# Patient Record
Sex: Male | Born: 1980 | Race: White | Hispanic: Yes | Marital: Single | Smoking: Current every day smoker
Health system: Southern US, Community
[De-identification: ages and names within clinical notes are randomized; demographics above are authoritative.]

---

## 2006-08-22 ENCOUNTER — Emergency Department (HOSPITAL_COMMUNITY): Admission: EM | Admit: 2006-08-22 | Discharge: 2006-08-22 | Payer: Self-pay | Admitting: Emergency Medicine

## 2013-04-15 ENCOUNTER — Encounter (HOSPITAL_COMMUNITY): Payer: Self-pay | Admitting: Emergency Medicine

## 2013-04-15 ENCOUNTER — Emergency Department (HOSPITAL_COMMUNITY)
Admission: EM | Admit: 2013-04-15 | Discharge: 2013-04-15 | Disposition: A | Discharge status: Home / Self Care | Payer: Self-pay | Attending: Emergency Medicine | Admitting: Emergency Medicine

## 2013-04-15 DIAGNOSIS — F17200 Nicotine dependence, unspecified, uncomplicated: Secondary | ICD-10-CM | POA: Insufficient documentation

## 2013-04-15 DIAGNOSIS — R609 Edema, unspecified: Secondary | ICD-10-CM | POA: Insufficient documentation

## 2013-04-15 DIAGNOSIS — K047 Periapical abscess without sinus: Secondary | ICD-10-CM | POA: Insufficient documentation

## 2013-04-15 DIAGNOSIS — K029 Dental caries, unspecified: Secondary | ICD-10-CM | POA: Insufficient documentation

## 2013-04-15 MED ORDER — PENICILLIN V POTASSIUM 500 MG PO TABS: 500.0000 mg | ORAL_TABLET | Freq: Four times a day (QID) | ORAL | Status: AC

## 2013-04-15 MED ORDER — HYDROCODONE-ACETAMINOPHEN 5-325 MG PO TABS: 1.0000 | ORAL_TABLET | ORAL | Status: AC | PRN

## 2013-04-15 NOTE — ED Provider Notes (Signed)
Medical screening examination/treatment/procedure(s) were performed by non-physician practitioner and as supervising physician I was immediately available for consultation/collaboration.  EKG Interpretation   None         Rolan BuccoMelanie Merrin Mcvicker, MD 04/15/13 [phone]

## 2013-04-15 NOTE — ED Provider Notes (Signed)
CSN: [id number]     Arrival date & time 04/15/13  0408 History   First MD Initiated Contact with Patient 04/15/13 [phone]     Chief Complaint  Patient presents with  . Dental Pain   (Consider location/radiation/quality/duration/timing/severity/associated sxs/prior Treatment) The history is provided by the patient and medical records.   This is a 33 year old male with no significant past surgical history presenting to the ED for left upper dental pain and left facial swelling over the past 3 days. Patient states he has a significant cavity of his left upper molar which was evaluated by a dentist last year. States over the weekend pain has intensified and swelling has progressed. Denies any fevers, sweats, or chills. No difficulty swallowing or breathing. Has been taking over-the-counter Motrin without improvement. Vital signs stable on arrival  History reviewed. No pertinent past medical history. History reviewed. No pertinent past surgical history. No family history on file. History  Substance Use Topics  . Smoking status: Current Every Day Smoker  . Smokeless tobacco: Not on file  . Alcohol Use: Yes    Review of Systems  HENT: Positive for dental problem.   All other systems reviewed and are negative.    Allergies  Review of patient's allergies indicates no known allergies.  Home Medications   Current Outpatient Rx  Name  Route  Sig  Dispense  Refill  . ibuprofen (ADVIL,MOTRIN) 200 MG tablet   Oral   Take 400 mg by mouth every 6 (six) hours as needed for moderate pain.         Marland Kitchen. HYDROcodone-acetaminophen (NORCO/VICODIN) 5-325 MG per tablet   Oral   Take 1 tablet by mouth every 4 (four) hours as needed.   12 tablet   0   . penicillin v potassium (VEETID) 500 MG tablet   Oral   Take 1 tablet (500 mg total) by mouth 4 (four) times daily.   40 tablet   0    BP 158/91  Pulse 87  Temp(Src) 98.4 F (36.9 C) (Oral)  Resp 18  SpO2 98%  Physical Exam  Nursing note and  vitals reviewed. Constitutional: He is oriented to person, place, and time. He appears well-developed and well-nourished. No distress.  HENT:  Head: Normocephalic and atraumatic.  Mouth/Throat: Uvula is midline, oropharynx is clear and moist and mucous membranes are normal. No oral lesions. No trismus in the jaw. Abnormal dentition. Dental abscesses and dental caries present. No uvula swelling. No oropharyngeal exudate, posterior oropharyngeal edema, posterior oropharyngeal erythema or tonsillar abscesses.    Teeth largely in fair dentition, left upper molar broken with large cavity present, surrounding gingiva swollen and erythematous with dental abscess present, left upper cheek swelling, no neck swelling, handling secretions appropriately, no trismus  Eyes: Conjunctivae and EOM are normal. Pupils are equal, round, and reactive to light.  Neck: Normal range of motion.  Cardiovascular: Normal rate, regular rhythm and normal heart sounds.   Pulmonary/Chest: Effort normal and breath sounds normal. No respiratory distress. He has no wheezes.  Abdominal: Soft. Bowel sounds are normal.  Musculoskeletal: Normal range of motion.  Neurological: He is alert and oriented to person, place, and time.  Skin: Skin is warm and dry. He is not diaphoretic.  Psychiatric: He has a normal mood and affect.    ED Course  Procedures (including critical care time) Labs Review Labs Reviewed - No data to display Imaging Review No results found.  EKG Interpretation   None  MDM   1. Dental abscess    Dental pain and facial swelling with dental abscess present.  No neck swelling, airway patent.  Will start on abx, pain meds.  FU with dentist-- referral and resource guide provided. Discussed plan with pt, he agreed. Return precautions advised.  Garlon Hatchet, PA-C 04/15/13 [phone]

## 2013-04-15 NOTE — Discharge Instructions (Signed)
Take the prescribed medication as directed.  Do not drive while taking vicodin. Follow-up with dentist-- referral provided and resource guide below with additional offices. Return to the ED for new or worsening symptoms.   Emergency Department Resource Guide 1) Find a Doctor and Pay Out of Pocket Although you won't have to find out who is covered by your insurance plan, it is a good idea to ask around and get recommendations. You will then need to call the office and see if the doctor you have chosen will accept you as a new patient and what types of options they offer for patients who are self-pay. Some doctors offer discounts or will set up payment plans for their patients who do not have insurance, but you will need to ask so you aren't surprised when you get to your appointment.  2) Contact Your Local Health Department Not all health departments have doctors that can see patients for sick visits, but many do, so it is worth a call to see if yours does. If you don't know where your local health department is, you can check in your phone book. The CDC also has a tool to help you locate your state's health department, and many state websites also have listings of all of their local health departments.  3) Find a Walk-in Clinic If your illness is not likely to be very severe or complicated, you may want to try a walk in clinic. These are popping up all over the country in pharmacies, drugstores, and shopping centers. They're usually staffed by nurse practitioners or physician assistants that have been trained to treat common illnesses and complaints. They're usually fairly quick and inexpensive. However, if you have serious medical issues or chronic medical problems, these are probably not your best option.  No Primary Care Doctor: - Call Health Connect at  [phone] - they can help you locate a primary care doctor that  accepts your insurance, provides certain services, etc. - Physician Referral  Service- [phone]  Chronic Pain Problems: Organization         Address  Phone   Notes  [facility]  [phone] Patients need to be referred by their primary care doctor.   Medication Assistance: Organization         Address  Phone   Notes  [facility] [address] [place], [place] [address] [phone]) [phone] --Must be a resident of [facility] [place] -- Must have NO insurance coverage whatsoever (no Medicaid/ Medicare, etc.) -- The pt. MUST have a primary care doctor that directs their care regularly and follows them in the community   MedAssist  [phone]   [employer]  [phone]    Agencies that provide inexpensive medical care: Organization         Address  Phone   Notes  [facility]  [phone]   [facility]    [phone]   [facility] [address] [place], [place] [address] [phone]   [facility] [address], [place] [phone]) [phone]   [facility]anned [facility]    [phone]) [phone]   [facility]    [phone]   [facility] and [facility]  [address], [place] Phone:  [phone]) [phone], Fax:  [phone] Hours of Operation:  9 am - 6 pm, M-F.  Also accepts Medicaid/Medicare and self-pay.  [facility]  [address], [place] Phone: [phone], Fax: [phone]. Hours of Operation:  8:30 am - 5:30 pm, M-F.  Also accepts Medicaid and self-pay.  [facility] [place] [address] [place] Phone: [phone]   George Mason, [place], [place] [phone], Ext. [phone] Mondays & Thursdays: 7-9 AM.  First 15 patients are seen on a first come, first serve basis.    [place] Providers:  Organization         Address  Phone   Notes  [facility] [address], [place] [phone] Also accepts self-pay patients.  [facility] [address], [place]  [phone]   [place], [address], [place] [phone]   [facility] [address], [place] [phone]   Lucianne Lei [address], [place]   [phone] Only accepts [place] Access [place] patients after they have their name applied to their card.   Self-Pay (no insurance) in [facility]:  Organization         Address  Phone   Notes  Sickle Cell Patients, [facility] [place] [phone]   [facility] [place] [phone]   [facility] [place]  [place], [place],  [phone]   [facility]/Dr. Osei-Bonsu  [address], [place] or [address], [place] [phone] Phone number for both [place] and [place] locations is the same.  Urgent Medical and [facility] [address], [place] [phone]   [facility] [address] [place] or [address] [phone]   [facility] [address], [place] [phone], phone; [phone], fax Sees patients 1st and 3rd Saturday of every month.  Must not qualify for public or private insurance (i.e. Medicaid, Medicare, [place] Health Choice, Veterans' Benefits)  Household income should be no more than 200% of the poverty level The clinic cannot treat you if you are pregnant or think you are pregnant  Sexually transmitted diseases are not treated at the clinic.    Dental Care: Organization         Address  Phone  Notes  [facility] Department of [facility] [phone] Accepts children up to age 4 who are enrolled in  [place] or [place]; pregnant women with a Medicaid card; and children who have applied for Medicaid or [place] Health Choice, but were declined, whose parents can pay a reduced fee at time of service.  [facility] of [facility]  [address], [place] [phone] Accepts children up to age 46 who are enrolled in [place] or [place]; pregnant women with a Medicaid card; and children who have applied for Medicaid or [place] Health Choice, but were declined, whose parents can pay a reduced fee at time of service.  [place] Adult [facility] Access PROGRAM  [place] [phone] Patients are seen by appointment only. Walk-ins are not accepted. [facility] will see patients 39 years of age and older. Monday - Tuesday (8am-5pm) Most Wednesdays (8:30-5pm) $30 per visit, cash only  [place] [facility]  [address], [facility] [phone] Patients are seen by appointment only. Walk-ins are not accepted. [place] will see patients 55 years of age and older. One Wednesday Evening (Monthly: Volunteer Based).  $30 per visit, cash only  [place]  [phone] for adults; Children under age 16, call Graduate Pediatric Dentistry at [phone]. Children aged 15-14, please call [phone] to request a pediatric application.  Dental services are provided in all areas of dental care including fillings, crowns and bridges, complete and partial dentures, implants, gum treatment, root canals, and extractions. Preventive care is also provided. Treatment is provided to both adults and children. Patients are selected via a lottery and there is often a waiting list.   [facility] [address] [place]  [phone] [email or web address]   [facility] [address] [place], [place] [phone], Ext. [phone] Second and Fourth Thursday of each month, opens at 6:30  AM; Clinic ends at 9 AM.  Patients are seen on a first-come first-served basis, and a limited number are seen during each clinic.   [facility]  [address] Hillard Danker [place], [place] [phone]   Eligibility Requirements You must have lived in [place], [place], or [place] counties for at least the last three months.   You cannot be eligible for state or federal sponsored [employer], including [employer], [place], or [employer].   You generally cannot be eligible for healthcare insurance through your employer.    How to apply: Eligibility screenings are held every Tuesday and Wednesday afternoon from 1:00 pm until 4:00 pm. You do not need an appointment for the interview!  [facility] [address] [place], [place]   [employer]  [place] Department  [place]  [phone]    Behavioral Health Resources in the Community: Intensive Outpatient Programs Organization         Address  Phone  Notes  [place]. [address], [place], [place] [phone]   [facility] [address], [place], [employer]   ADS: Alcohol & Drug Svcs [address], [place], [place]   [place] [address]  [place], [place] or [phone]   Substance Abuse Resources Organization         Address  Phone  Notes  Alcohol and Drug Services  [phone]   [place]  [phone]   [facility] [place]   Chinita Pester  [phone]   Residential & Outpatient Substance Abuse Program  [phone]   Psychological Services Organization         Address  Phone  Notes  [facility] [place]  [place]  [phone]   [place] [address] [place] [phone] or  [phone]    Mobile Crisis Teams Organization         Address  Phone  Notes  [facility], Mobile Crisis Care Unit  [phone]   [facility]  [address] [place], [place]   [place] [address] [place] [phone]    Self-Help/Support Groups Organization         Address  Phone             Notes  [facility] [place] - variety of support groups  [place] Call for more information  Narcotics Anonymous (NA), [facility]aring [facility] [address], [employer]   2 meetings at this location   Special educational needs teacher         Address  Phone  Notes  [facility] [place],    [place]  [phone]   [facility]  [address] [place] [phone], [place], [place]   [place], [place] [phone] Admissions: 8am-3pm M-F  Incentives Substance [place] [address]    [place], [place] [phone]   [facility] [address] [place], [place], [place]   [facility] [address]  [place], [place]   [facility] - Intensive Outpatient [address], [place], [place]   [facility] ([place].,  [place], [place] [phone] or [phone]   [facility] Services (RTS) [address], [place], [place] Accepts Medicaid  [facility] [address],  [place] [phone] Substance Abuse/Addiction Treatment   [facility] Organization         Address  Phone  Notes  [facility]  [phone]   Domenic Schwab, PhD [address] Arlis Porta [place], [place]   [phone] or [phone]   [place], [place] [phone]   [facility] [address]  [place], [place] [phone] Insurance/Medicaid/sponsorship through [facility] and Families [address], [address] [place]                                    [place], [place] [phone] [place] [address], [place] [phone]    Dr. Adele Schilder  [phone]   [facility] Dept. 1) [address], [place] 2) [place] 3)  [place] [address], [place] [phone]  [phone]   [place] [phone] or [phone] (After Hours)

## 2013-04-15 NOTE — ED Notes (Signed)
The pt is c/o a toothache since Friday with lt face swelling

## 2013-04-15 NOTE — ED Notes (Signed)
Report received, assumed care.  

## 2016-02-26 ENCOUNTER — Encounter (HOSPITAL_COMMUNITY): Payer: Self-pay | Admitting: Emergency Medicine

## 2016-02-26 ENCOUNTER — Emergency Department (HOSPITAL_COMMUNITY)
Admission: EM | Admit: 2016-02-26 | Discharge: 2016-02-26 | Disposition: A | Discharge status: Home / Self Care | Payer: Self-pay | Attending: Emergency Medicine | Admitting: Emergency Medicine

## 2016-02-26 ENCOUNTER — Emergency Department (HOSPITAL_COMMUNITY): Payer: Self-pay

## 2016-02-26 DIAGNOSIS — R51 Headache: Secondary | ICD-10-CM

## 2016-02-26 DIAGNOSIS — F17200 Nicotine dependence, unspecified, uncomplicated: Secondary | ICD-10-CM | POA: Insufficient documentation

## 2016-02-26 DIAGNOSIS — G4489 Other headache syndrome: Secondary | ICD-10-CM | POA: Insufficient documentation

## 2016-02-26 DIAGNOSIS — R519 Headache, unspecified: Secondary | ICD-10-CM

## 2016-02-26 DIAGNOSIS — Z79899 Other long term (current) drug therapy: Secondary | ICD-10-CM | POA: Insufficient documentation

## 2016-02-26 DIAGNOSIS — I677 Cerebral arteritis, not elsewhere classified: Secondary | ICD-10-CM | POA: Insufficient documentation

## 2016-02-26 DIAGNOSIS — R791 Abnormal coagulation profile: Secondary | ICD-10-CM | POA: Insufficient documentation

## 2016-02-26 LAB — URINALYSIS, ROUTINE W REFLEX MICROSCOPIC
BILIRUBIN URINE: NEGATIVE
Glucose, UA: NEGATIVE mg/dL
Hgb urine dipstick: NEGATIVE
KETONES UR: NEGATIVE mg/dL
Leukocytes, UA: NEGATIVE
NITRITE: NEGATIVE
PH: 7.5 (ref 5.0–8.0)
PROTEIN: 30 mg/dL — AB
Specific Gravity, Urine: 1.039 — ABNORMAL HIGH (ref 1.005–1.030)

## 2016-02-26 LAB — APTT: aPTT: 29 seconds (ref 24–36)

## 2016-02-26 LAB — URINE MICROSCOPIC-ADD ON
RBC / HPF: NONE SEEN RBC/hpf (ref 0–5)
Squamous Epithelial / LPF: NONE SEEN

## 2016-02-26 LAB — COMPREHENSIVE METABOLIC PANEL
ALBUMIN: 4.7 g/dL (ref 3.5–5.0)
ALK PHOS: 79 U/L (ref 38–126)
ALT: 51 U/L (ref 17–63)
AST: 36 U/L (ref 15–41)
Anion gap: 11 (ref 5–15)
BILIRUBIN TOTAL: 1.1 mg/dL (ref 0.3–1.2)
BUN: 9 mg/dL (ref 6–20)
CALCIUM: 9.5 mg/dL (ref 8.9–10.3)
CO2: 25 mmol/L (ref 22–32)
CREATININE: 0.89 mg/dL (ref 0.61–1.24)
Chloride: 101 mmol/L (ref 101–111)
GFR calc Af Amer: >60 mL/min (ref >60)
GLUCOSE: 114 mg/dL — AB (ref 65–99)
POTASSIUM: 3.7 mmol/L (ref 3.5–5.1)
Sodium: 137 mmol/L (ref 135–145)
TOTAL PROTEIN: 8.3 g/dL — AB (ref 6.5–8.1)

## 2016-02-26 LAB — CBC
HEMATOCRIT: 49.4 % (ref 39.0–52.0)
Hemoglobin: 18.1 g/dL — ABNORMAL HIGH (ref 13.0–17.0)
MCH: 32.5 pg (ref 26.0–34.0)
MCHC: 36.6 g/dL — AB (ref 30.0–36.0)
MCV: 88.7 fL (ref 78.0–100.0)
PLATELETS: 257 10*3/uL (ref 150–400)
RBC: 5.57 MIL/uL (ref 4.22–5.81)
RDW: 13.1 % (ref 11.5–15.5)
WBC: 10.1 10*3/uL (ref 4.0–10.5)

## 2016-02-26 LAB — PROTIME-INR
INR: 0.99
PROTHROMBIN TIME: 13.1 s (ref 11.4–15.2)

## 2016-02-26 MED ORDER — KETOROLAC TROMETHAMINE 30 MG/ML IJ SOLN
30.0000 mg | Freq: Once | INTRAMUSCULAR | Status: AC
  Administered 2016-02-26: 30 mg via INTRAVENOUS
  Filled 2016-02-26: qty 1

## 2016-02-26 MED ORDER — DEXAMETHASONE SODIUM PHOSPHATE 10 MG/ML IJ SOLN
10.0000 mg | Freq: Once | INTRAMUSCULAR | Status: AC
  Administered 2016-02-26: 10 mg via INTRAVENOUS
  Filled 2016-02-26: qty 1

## 2016-02-26 MED ORDER — IOPAMIDOL (ISOVUE-370) INJECTION 76%
INTRAVENOUS | Status: AC
  Administered 2016-02-26: 50 mL
  Filled 2016-02-26: qty 50

## 2016-02-26 MED ORDER — NAPROXEN 500 MG PO TABS: 500.0000 mg | ORAL_TABLET | Freq: Two times a day (BID) | ORAL | 0 refills | Status: AC

## 2016-02-26 MED ORDER — DIPHENHYDRAMINE HCL 50 MG/ML IJ SOLN
25.0000 mg | Freq: Once | INTRAMUSCULAR | Status: AC
  Administered 2016-02-26: 25 mg via INTRAVENOUS
  Filled 2016-02-26: qty 1

## 2016-02-26 MED ORDER — METOCLOPRAMIDE HCL 5 MG/ML IJ SOLN
10.0000 mg | Freq: Once | INTRAMUSCULAR | Status: AC
  Administered 2016-02-26: 10 mg via INTRAVENOUS
  Filled 2016-02-26: qty 2

## 2016-02-26 NOTE — ED Triage Notes (Signed)
Pt sts HA starting today; pt noted to be hypertensive; pt unsure of hx; pt sts drank ETOH yesterday

## 2016-02-26 NOTE — ED Notes (Signed)
Pt stable, ambulatory, states understanding of discharge instructions 

## 2016-02-26 NOTE — Discharge Instructions (Addendum)
Follow-up with one of the radiology doctors if not better in 3 days time. You would need to call for an appointment.

## 2016-02-26 NOTE — ED Provider Notes (Signed)
[facility]-EMERGENCY DEPT Provider Note   CSN: [id number] Arrival date & time: 02/26/16  1813     History   Chief Complaint Chief Complaint  Patient presents with  . Headache    HPI Terrion TEYON ODETTE is a 35 y.o. male.  The history is provided by the patient.  Headache   This is a new problem. Episode onset: 4 hours ago. The problem occurs constantly. The problem has not changed since onset.The headache is associated with nothing. Pain location: diffuse, base of neck. The quality of the pain is described as sharp and throbbing. The pain is severe. The pain does not radiate. Associated symptoms include nausea. Pertinent negatives include no fever and no vomiting. He has tried nothing for the symptoms.    History reviewed. No pertinent past medical history.  There are no active problems to display for this patient.   History reviewed. No pertinent surgical history.     Home Medications    Prior to Admission medications   Medication Sig Start Date End Date Taking? Authorizing Provider  HYDROcodone-acetaminophen (NORCO/VICODIN) 5-325 MG per tablet Take 1 tablet by mouth every 4 (four) hours as needed. 04/15/13   Garlon Hatchet, PA-C  ibuprofen (ADVIL,MOTRIN) 200 MG tablet Take 400 mg by mouth every 6 (six) hours as needed for moderate pain.    Historical Provider, MD    Family History History reviewed. No pertinent family history.  Social History Social History  Substance Use Topics  . Smoking status: Current Every Day Smoker  . Smokeless tobacco: Never Used  . Alcohol use Yes     Allergies   Patient has no known allergies.   Review of Systems Review of Systems  Constitutional: Negative for fever.  Gastrointestinal: Positive for nausea. Negative for vomiting.  Neurological: Positive for headaches.  All other systems reviewed and are negative.    Physical Exam Updated Vital Signs BP (!) 188/116 (BP Location: Right Arm)   Pulse 99   Temp 98 F (36.7 C) (Oral)    Resp 20   SpO2 97%   Physical Exam  Constitutional: He is oriented to person, place, and time. He appears well-developed and well-nourished. No distress.  HENT:  Head: Normocephalic and atraumatic.  Nose: Nose normal.  Eyes: Conjunctivae are normal.  Neck: Neck supple. No tracheal deviation present.  Cardiovascular: Normal rate, regular rhythm and normal heart sounds.   Pulmonary/Chest: Effort normal and breath sounds normal. No respiratory distress.  Abdominal: Soft. He exhibits no distension. There is no tenderness.  Neurological: He is alert and oriented to person, place, and time. He has normal strength. No cranial nerve deficit or sensory deficit. Coordination and gait normal. GCS eye subscore is 4. GCS verbal subscore is 5. GCS motor subscore is 6.  Normal finger to nose testing and rapid alternating movement   Skin: Skin is warm and dry. Capillary refill takes less than 2 seconds.  Psychiatric: He has a normal mood and affect.  Vitals reviewed.    ED Treatments / Results  Labs (all labs ordered are listed, but only abnormal results are displayed) Labs Reviewed  COMPREHENSIVE METABOLIC PANEL - Abnormal; Notable for the following:       Result Value   Glucose, Bld 114 (*)    Total Protein 8.3 (*)    All other components within normal limits  CBC - Abnormal; Notable for the following:    Hemoglobin 18.1 (*)    MCHC 36.6 (*)    All other components within normal  limits  APTT  PROTIME-INR  URINALYSIS, ROUTINE W REFLEX MICROSCOPIC (NOT AT [facility])  RAPID URINE DRUG SCREEN, HOSP PERFORMED    EKG  EKG Interpretation None       Radiology Ct Angio Head W Or Wo Contrast  Result Date: 02/26/2016 CLINICAL DATA:  Severe headache and vomiting after drinking a bottle of beer yesterday. Hypertensive. EXAM: CT ANGIOGRAPHY HEAD AND NECK TECHNIQUE: Multidetector CT imaging of the head and neck was performed using the standard protocol during bolus administration of intravenous  contrast. Multiplanar CT image reconstructions and MIPs were obtained to evaluate the vascular anatomy. Carotid stenosis measurements (when applicable) are obtained utilizing NASCET criteria, using the distal internal carotid diameter as the denominator. CONTRAST:  50 cc Isovue 370 COMPARISON:  None. FINDINGS: CT HEAD FINDINGS BRAIN: The ventricles and sulci are normal. No intraparenchymal hemorrhage, mass effect nor midline shift. No acute large vascular territory infarcts. No abnormal extra-axial fluid collections. Basal cisterns are patent. VASCULAR: Unremarkable. SKULL/SOFT TISSUES: No skull fracture. No significant soft tissue swelling. ORBITS/SINUSES: The included ocular globes and orbital contents are normal.Minimal paranasal sinus mucosal thickening. Mastoid air cells are well aerated. OTHER: None. CTA NECK AORTIC ARCH: Normal appearance of the thoracic arch, normal branch pattern. The origins of the innominate, left Common carotid artery and subclavian artery are widely patent. RIGHT CAROTID SYSTEM: Common carotid artery is widely patent, coursing in a straight line fashion. Normal appearance of the carotid bifurcation without hemodynamically significant stenosis by NASCET criteria. Normal appearance of the included internal carotid artery. LEFT CAROTID SYSTEM: Common carotid artery is widely patent, coursing in a straight line fashion. Normal appearance of the carotid bifurcation without hemodynamically significant stenosis by NASCET criteria. Normal appearance of the included internal carotid artery. VERTEBRAL ARTERIES:RIGHT vertebral artery is dominant. Normal appearance of the vertebral arteries, which appear widely patent. SKELETON: No acute osseous process though bone windows have not been submitted. OTHER NECK: Soft tissues of the neck are non-acute though, not tailored for evaluation. Effaced LEFT piriform sinus may be transient, no discrete mass. CTA HEAD ANTERIOR CIRCULATION: Normal appearance of  the cervical internal carotid arteries, petrous, cavernous and supra clinoid internal carotid arteries. Widely patent anterior communicating artery. Patent anterior and middle cerebral arteries with mild luminal irregularity of the mid and distal vessels. No large vessel occlusion, hemodynamically significant stenosis, dissection, luminal irregularity, contrast extravasation or aneurysm. POSTERIOR CIRCULATION: Normal appearance of the vertebral arteries, vertebrobasilar junction and basilar artery, as well as main branch vessels. Patent with mild luminal irregularity of the mid and distal posterior cerebral arteries. No large vessel occlusion, hemodynamically significant stenosis, dissection, luminal irregularity, contrast extravasation or aneurysm. VENOUS SINUSES: Major dural venous sinuses are patent though not tailored for evaluation on this angiographic examination. ANATOMIC VARIANTS: None. DELAYED PHASE: No abnormal intracranial enhancement. IMPRESSION: CTA HEAD:  Negative. CTA NECK: Negative. CTA HEAD: Mild suspected vasculitis. No emergent large vessel occlusion or high-grade stenosis. Electronically Signed   By: Awilda Metroourtnay  Bloomer M.D.   On: 02/26/2016 21:23   Ct Angio Neck W And/or Wo Contrast  Result Date: 02/26/2016 CLINICAL DATA:  Severe headache and vomiting after drinking a bottle of beer yesterday. Hypertensive. EXAM: CT ANGIOGRAPHY HEAD AND NECK TECHNIQUE: Multidetector CT imaging of the head and neck was performed using the standard protocol during bolus administration of intravenous contrast. Multiplanar CT image reconstructions and MIPs were obtained to evaluate the vascular anatomy. Carotid stenosis measurements (when applicable) are obtained utilizing NASCET criteria, using the distal internal carotid diameter as the  denominator. CONTRAST:  50 cc Isovue 370 COMPARISON:  None. FINDINGS: CT HEAD FINDINGS BRAIN: The ventricles and sulci are normal. No intraparenchymal hemorrhage, mass effect  nor midline shift. No acute large vascular territory infarcts. No abnormal extra-axial fluid collections. Basal cisterns are patent. VASCULAR: Unremarkable. SKULL/SOFT TISSUES: No skull fracture. No significant soft tissue swelling. ORBITS/SINUSES: The included ocular globes and orbital contents are normal.Minimal paranasal sinus mucosal thickening. Mastoid air cells are well aerated. OTHER: None. CTA NECK AORTIC ARCH: Normal appearance of the thoracic arch, normal branch pattern. The origins of the innominate, left Common carotid artery and subclavian artery are widely patent. RIGHT CAROTID SYSTEM: Common carotid artery is widely patent, coursing in a straight line fashion. Normal appearance of the carotid bifurcation without hemodynamically significant stenosis by NASCET criteria. Normal appearance of the included internal carotid artery. LEFT CAROTID SYSTEM: Common carotid artery is widely patent, coursing in a straight line fashion. Normal appearance of the carotid bifurcation without hemodynamically significant stenosis by NASCET criteria. Normal appearance of the included internal carotid artery. VERTEBRAL ARTERIES:RIGHT vertebral artery is dominant. Normal appearance of the vertebral arteries, which appear widely patent. SKELETON: No acute osseous process though bone windows have not been submitted. OTHER NECK: Soft tissues of the neck are non-acute though, not tailored for evaluation. Effaced LEFT piriform sinus may be transient, no discrete mass. CTA HEAD ANTERIOR CIRCULATION: Normal appearance of the cervical internal carotid arteries, petrous, cavernous and supra clinoid internal carotid arteries. Widely patent anterior communicating artery. Patent anterior and middle cerebral arteries with mild luminal irregularity of the mid and distal vessels. No large vessel occlusion, hemodynamically significant stenosis, dissection, luminal irregularity, contrast extravasation or aneurysm. POSTERIOR CIRCULATION:  Normal appearance of the vertebral arteries, vertebrobasilar junction and basilar artery, as well as main branch vessels. Patent with mild luminal irregularity of the mid and distal posterior cerebral arteries. No large vessel occlusion, hemodynamically significant stenosis, dissection, luminal irregularity, contrast extravasation or aneurysm. VENOUS SINUSES: Major dural venous sinuses are patent though not tailored for evaluation on this angiographic examination. ANATOMIC VARIANTS: None. DELAYED PHASE: No abnormal intracranial enhancement. IMPRESSION: CTA HEAD:  Negative. CTA NECK: Negative. CTA HEAD: Mild suspected vasculitis. No emergent large vessel occlusion or high-grade stenosis. Electronically Signed   By: Awilda Metroourtnay  Bloomer M.D.   On: 02/26/2016 21:23    Procedures Procedures (including critical care time)  Medications Ordered in ED Medications  metoCLOPramide (REGLAN) injection 10 mg (10 mg Intravenous Given 02/26/16 2056)  diphenhydrAMINE (BENADRYL) injection 25 mg (25 mg Intravenous Given 02/26/16 2056)  iopamidol (ISOVUE-370) 76 % injection (50 mLs  Contrast Given 02/26/16 2010)  dexamethasone (DECADRON) injection 10 mg (10 mg Intravenous Given 02/26/16 2104)  ketorolac (TORADOL) 30 MG/ML injection 30 mg (30 mg Intravenous Given 02/26/16 2104)     Initial Impression / Assessment and Plan / ED Course  I have reviewed the triage vital signs and the nursing notes.  Pertinent labs & imaging results that were available during my care of the patient were reviewed by me and considered in my medical decision making (see chart for details).  Clinical Course as of Feb 25 2030  Thu Feb 26, 2016  2022 Sodium: 137 [DK]    Clinical Course User Index [DK] Lyndal Pulleyaniel Zendayah Hardgrave, MD    35 year old male presents with sudden onset headache that started 4 hours ago while he was at rest. He was having a beer at his friend's house and had a sudden onset of high intensity headache that he has never had  before raising suspicion of spontaneous subarachnoid hemorrhage. No known history of aneurysm or family history of aneurysms.  CT and CTA were ordered to evaluate for detection of potential hemorrhage or aneurysm. Treatment with Reglan and Benadryl empirically pending results of studies.  I was able to discuss the images with radiology who are concerned for a mild cerebral vasculitis which may be the etiology of patient's symptoms. Added Decadron and Toradol to migraine cocktail to help alleviate symptoms. Plan will be for NSAIDs going forward if the patient's symptoms are well controlled in the ED. UDS is added to workup to evaluate for cocaine abuse but patient denies.  Differential considerations include primary central vasculitis, drug abuse, other rheumatic causes. Patient has no stroke symptoms currently so plan will be to treat symptomatically with anti-inflammatories and steroids. Would consult neurology if patient's symptoms are refractory to supportive care. Care transferred to Dr. Effie Shy at 9 PM with plan to reevaluate the patient following medical intervention.  If the patient responds well to supportive measures would recommend outpatient neurology evaluation for further workup and referral was placed in discharge information.  Final Clinical Impressions(s) / ED Diagnoses   Final diagnoses:  Sudden onset of severe headache  Cerebral vasculitis    New Prescriptions New Prescriptions   NAPROXEN (NAPROSYN) 500 MG TABLET    Take 1 tablet (500 mg total) by mouth 2 (two) times daily with a meal.     Lyndal Pulley, MD 02/26/16 2212

## 2016-02-26 NOTE — ED Provider Notes (Signed)
Reevaluation, treatment to assess for progression. At 23:00 hours, patient states his headache is 100% resolved. I discussed the findings of the CT imaging with him. He understands that he will need follow-up with neurology, if not continuing to improve. He'll be treated with a course of Naprosyn. He is given referral to neurology to see when necessary if not better in 3 days time.   Philip BaleElliott Iantha Titsworth, MD 02/26/16 [phone]

## 2016-02-27 LAB — RAPID URINE DRUG SCREEN, HOSP PERFORMED
Amphetamines: NOT DETECTED
BARBITURATES: NOT DETECTED
Benzodiazepines: NOT DETECTED
COCAINE: NOT DETECTED
OPIATES: NOT DETECTED
Tetrahydrocannabinol: NOT DETECTED

## 2018-07-16 IMAGING — CT CT ANGIO HEAD
1 of 14 series · 5 of 47 positions shown · IV contrast (Omni 300)
Comparison: None.

CLINICAL DATA: Severe headache and vomiting after drinking a bottle
of Kretschmer yesterday. Hypertensive.

EXAM:
CT ANGIOGRAPHY HEAD AND NECK
TECHNIQUE: Multidetector CT imaging of the head and neck was performed using
the standard protocol during bolus administration of intravenous
contrast. Multiplanar CT image reconstructions and MIPs were
obtained to evaluate the vascular anatomy. Carotid stenosis
measurements (when applicable) are obtained utilizing NASCET
criteria, using the distal internal carotid diameter as the
denominator.
CONTRAST:  50 cc Isovue 370

[Series 9: cow 2.0 · axial · 0.46mm/px · z∈[-116,+124]mm · 5 of 180 slices shown]
[im 30/180  brain]
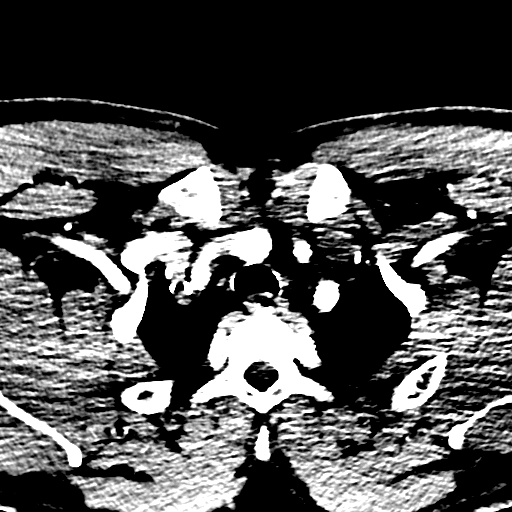
[im 60/180  bone]
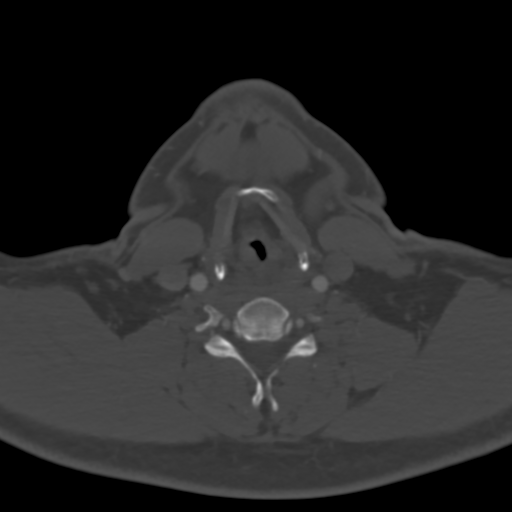
[im 90/180  brain]
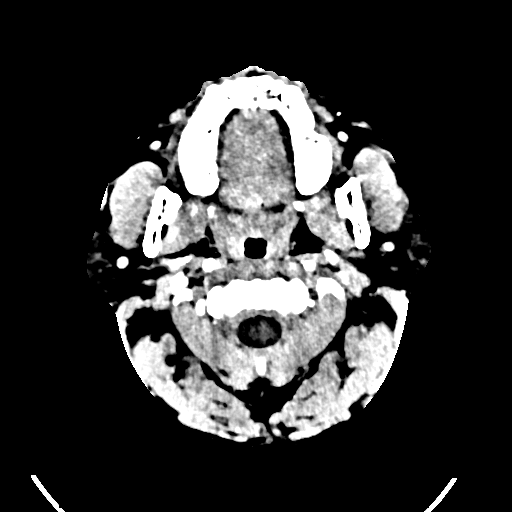
[im 120/180  bone]
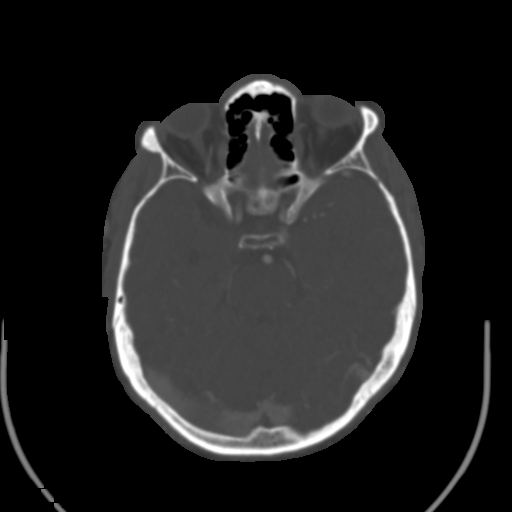
[im 150/180  brain]
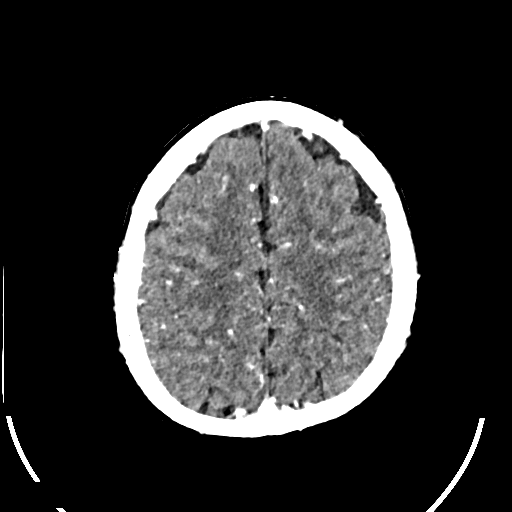

[5 of 47 positions shown; findings below may reference images not displayed]

FINDINGS: CT HEAD FINDINGS

BRAIN: The ventricles and sulci are normal. No intraparenchymal
hemorrhage, mass effect nor midline shift. No acute large vascular
territory infarcts. No abnormal extra-axial fluid collections. Basal
cisterns are patent.

VASCULAR: Unremarkable.

SKULL/SOFT TISSUES: No skull fracture. No significant soft tissue
swelling.

ORBITS/SINUSES: The included ocular globes and orbital contents are
normal.Minimal paranasal sinus mucosal thickening. Mastoid air cells
are well aerated.

OTHER: None.

CTA NECK

AORTIC ARCH: Normal appearance of the thoracic arch, normal branch
pattern. The origins of the innominate, left Common carotid artery
and subclavian artery are widely patent.

RIGHT CAROTID SYSTEM: Common carotid artery is widely patent,
coursing in a straight line fashion. Normal appearance of the
carotid bifurcation without hemodynamically significant stenosis by
NASCET criteria. Normal appearance of the included internal carotid
artery.

LEFT CAROTID SYSTEM: Common carotid artery is widely patent,
coursing in a straight line fashion. Normal appearance of the
carotid bifurcation without hemodynamically significant stenosis by
NASCET criteria. Normal appearance of the included internal carotid
artery.

VERTEBRAL ARTERIES:RIGHT vertebral artery is dominant. Normal
appearance of the vertebral arteries, which appear widely patent.

SKELETON: No acute osseous process though bone windows have not been
submitted.

OTHER NECK: Soft tissues of the neck are non-acute though, not
tailored for evaluation. Effaced LEFT piriform sinus may be
transient, no discrete mass.

CTA HEAD

ANTERIOR CIRCULATION: Normal appearance of the cervical internal
carotid arteries, petrous, cavernous and supra clinoid internal
carotid arteries. Widely patent anterior communicating artery.
Patent anterior and middle cerebral arteries with mild luminal
irregularity of the mid and distal vessels.

No large vessel occlusion, hemodynamically significant stenosis,
dissection, luminal irregularity, contrast extravasation or
aneurysm.

POSTERIOR CIRCULATION: Normal appearance of the vertebral arteries,
vertebrobasilar junction and basilar artery, as well as main branch
vessels. Patent with mild luminal irregularity of the mid and distal
posterior cerebral arteries.

No large vessel occlusion, hemodynamically significant stenosis,
dissection, luminal irregularity, contrast extravasation or
aneurysm.

VENOUS SINUSES: Major dural venous sinuses are patent though not
tailored for evaluation on this angiographic examination.

ANATOMIC VARIANTS: None.

DELAYED PHASE: No abnormal intracranial enhancement.
IMPRESSION: CTA HEAD:  Negative.

CTA NECK: Negative.

CTA HEAD: Mild suspected vasculitis. No emergent large vessel
occlusion or high-grade stenosis.
